# Patient Record
Sex: Female | Born: 1980 | Race: Black or African American | Hispanic: No | Marital: Single | Smoking: Never smoker
Health system: Southern US, Community
[De-identification: ages and names within clinical notes are randomized; demographics above are authoritative.]

## PROBLEM LIST (undated history)

## (undated) DIAGNOSIS — D869 Sarcoidosis, unspecified: Secondary | ICD-10-CM

## (undated) DIAGNOSIS — I10 Essential (primary) hypertension: Secondary | ICD-10-CM

---

## 2014-08-08 ENCOUNTER — Emergency Department (HOSPITAL_COMMUNITY): Payer: BLUE CROSS/BLUE SHIELD

## 2014-08-08 ENCOUNTER — Emergency Department (HOSPITAL_COMMUNITY)
Admission: EM | Admit: 2014-08-08 | Discharge: 2014-08-08 | Disposition: A | Discharge status: Home / Self Care | Payer: BLUE CROSS/BLUE SHIELD | Attending: Emergency Medicine | Admitting: Emergency Medicine

## 2014-08-08 ENCOUNTER — Encounter (HOSPITAL_COMMUNITY): Payer: Self-pay | Admitting: Emergency Medicine

## 2014-08-08 DIAGNOSIS — Z862 Personal history of diseases of the blood and blood-forming organs and certain disorders involving the immune mechanism: Secondary | ICD-10-CM | POA: Insufficient documentation

## 2014-08-08 DIAGNOSIS — M5417 Radiculopathy, lumbosacral region: Secondary | ICD-10-CM | POA: Diagnosis not present

## 2014-08-08 DIAGNOSIS — I10 Essential (primary) hypertension: Secondary | ICD-10-CM | POA: Insufficient documentation

## 2014-08-08 DIAGNOSIS — M25562 Pain in left knee: Secondary | ICD-10-CM | POA: Diagnosis not present

## 2014-08-08 HISTORY — DX: Sarcoidosis, unspecified: D86.9

## 2014-08-08 HISTORY — DX: Essential (primary) hypertension: I10

## 2014-08-08 MED ORDER — TRAMADOL HCL 50 MG PO TABS: 50.0000 mg | ORAL_TABLET | Freq: Four times a day (QID) | ORAL | Status: AC | PRN

## 2014-08-08 MED ORDER — PREDNISONE 20 MG PO TABS: 40.0000 mg | ORAL_TABLET | Freq: Every day | ORAL | Status: AC

## 2014-08-08 NOTE — ED Notes (Signed)
Pt. Stated, i started having knee (left) pain  fior about a week. No injury.  Pt. Had a brace on given to her by her sister.  Pt. Stated, seems like its worse and going to my hip

## 2014-08-08 NOTE — Discharge Instructions (Signed)
Start prednisone as prescribed until all gone. Take Tylenol for pain. Tramadol for severe pain. After finish prednisone started taking anti-inflammatory such as naproxen or ibuprofen. Try back exercises below. Keep the knee elevated, ice several times a day. Follow-up with your primary care doctor or orthopedic specialist if not improving.  Lumbosacral Radiculopathy Lumbosacral radiculopathy is a pinched nerve or nerves in the low back (lumbosacral area). When this happens you may have weakness in your legs and may not be able to stand on your toes. You may have pain going down into your legs. There may be difficulties with walking normally. There are many causes of this problem. Sometimes this may happen from an injury, or simply from arthritis or boney problems. It may also be caused by other illnesses such as diabetes. If there is no improvement after treatment, further studies may be done to find the exact cause. DIAGNOSIS  X-rays may be needed if the problems become long standing. Electromyograms may be done. This study is one in which the working of nerves and muscles is studied. HOME CARE INSTRUCTIONS   Applications of ice packs may be helpful. Ice can be used in a plastic bag with a towel around it to prevent frostbite to skin. This may be used every 2 hours for 20 to 30 minutes, or as needed, while awake, or as directed by your caregiver.  Only take over-the-counter or prescription medicines for pain, discomfort, or fever as directed by your caregiver.  If physical therapy was prescribed, follow your caregiver's directions. SEEK IMMEDIATE MEDICAL CARE IF:   You have pain not controlled with medications.  You seem to be getting worse rather than better.  You develop increasing weakness in your legs.  You develop loss of bowel or bladder control.  You have difficulty with walking or balance, or develop clumsiness in the use of your legs.  You have a fever. MAKE SURE YOU:    Understand these instructions.  Will watch your condition.  Will get help right away if you are not doing well or get worse. Document Released: 01/22/2005 Document Revised: 04/16/2011 Document Reviewed: 09/12/2007 [facility] Patient Information 2015 [place], [place]. This information is not intended to replace advice given to you by your health care provider. Make sure you discuss any questions you have with your health care provider.  Back Exercises These exercises may help you when beginning to rehabilitate your injury. Your symptoms may resolve with or without further involvement from your physician, physical therapist or athletic trainer. While completing these exercises, remember:   Restoring tissue flexibility helps normal motion to return to the joints. This allows healthier, less painful movement and activity.  An effective stretch should be held for at least 30 seconds.  A stretch should never be painful. You should only feel a gentle lengthening or release in the stretched tissue. STRETCH - Extension, Prone on Elbows   Lie on your stomach on the floor, a bed will be too soft. Place your palms about shoulder width apart and at the height of your head.  Place your elbows under your shoulders. If this is too painful, stack pillows under your chest.  Allow your body to relax so that your hips drop lower and make contact more completely with the floor.  Hold this position for __________ seconds.  Slowly return to lying flat on the floor. Repeat __________ times. Complete this exercise __________ times per day.  RANGE OF MOTION - Extension, Prone Press Ups   Lie on your  stomach on the floor, a bed will be too soft. Place your palms about shoulder width apart and at the height of your head.  Keeping your back as relaxed as possible, slowly straighten your elbows while keeping your hips on the floor. You may adjust the placement of your hands to maximize your comfort. As you gain  motion, your hands will come more underneath your shoulders.  Hold this position __________ seconds.  Slowly return to lying flat on the floor. Repeat __________ times. Complete this exercise __________ times per day.  RANGE OF MOTION- Quadruped, Neutral Spine   Assume a hands and knees position on a firm surface. Keep your hands under your shoulders and your knees under your hips. You may place padding under your knees for comfort.  Drop your head and point your tail bone toward the ground below you. This will round out your low back like an angry cat. Hold this position for __________ seconds.  Slowly lift your head and release your tail bone so that your back sags into a large arch, like an old horse.  Hold this position for __________ seconds.  Repeat this until you feel limber in your low back.  Now, find your "sweet spot." This will be the most comfortable position somewhere between the two previous positions. This is your neutral spine. Once you have found this position, tense your stomach muscles to support your low back.  Hold this position for __________ seconds. Repeat __________ times. Complete this exercise __________ times per day.  STRETCH - Flexion, Single Knee to Chest   Lie on a firm bed or floor with both legs extended in front of you.  Keeping one leg in contact with the floor, bring your opposite knee to your chest. Hold your leg in place by either grabbing behind your thigh or at your knee.  Pull until you feel a gentle stretch in your low back. Hold __________ seconds.  Slowly release your grasp and repeat the exercise with the opposite side. Repeat __________ times. Complete this exercise __________ times per day.  STRETCH - Hamstrings, Standing  Stand or sit and extend your right / left leg, placing your foot on a chair or foot stool  Keeping a slight arch in your low back and your hips straight forward.  Lead with your chest and lean forward at the  waist until you feel a gentle stretch in the back of your right / left knee or thigh. (When done correctly, this exercise requires leaning only a small distance.)  Hold this position for __________ seconds. Repeat __________ times. Complete this stretch __________ times per day. STRENGTHENING - Deep Abdominals, Pelvic Tilt   Lie on a firm bed or floor. Keeping your legs in front of you, bend your knees so they are both pointed toward the ceiling and your feet are flat on the floor.  Tense your lower abdominal muscles to press your low back into the floor. This motion will rotate your pelvis so that your tail bone is scooping upwards rather than pointing at your feet or into the floor.  With a gentle tension and even breathing, hold this position for __________ seconds. Repeat __________ times. Complete this exercise __________ times per day.  STRENGTHENING - Abdominals, Crunches   Lie on a firm bed or floor. Keeping your legs in front of you, bend your knees so they are both pointed toward the ceiling and your feet are flat on the floor. Cross your arms over your chest.  Slightly tip your chin down without bending your neck.  Tense your abdominals and slowly lift your trunk high enough to just clear your shoulder blades. Lifting higher can put excessive stress on the low back and does not further strengthen your abdominal muscles.  Control your return to the starting position. Repeat __________ times. Complete this exercise __________ times per day.  STRENGTHENING - Quadruped, Opposite UE/LE Lift   Assume a hands and knees position on a firm surface. Keep your hands under your shoulders and your knees under your hips. You may place padding under your knees for comfort.  Find your neutral spine and gently tense your abdominal muscles so that you can maintain this position. Your shoulders and hips should form a rectangle that is parallel with the floor and is not twisted.  Keeping your  trunk steady, lift your right hand no higher than your shoulder and then your left leg no higher than your hip. Make sure you are not holding your breath. Hold this position __________ seconds.  Continuing to keep your abdominal muscles tense and your back steady, slowly return to your starting position. Repeat with the opposite arm and leg. Repeat __________ times. Complete this exercise __________ times per day. Document Released: 02/09/2005 Document Revised: 04/16/2011 Document Reviewed: 05/06/2008 [facility] Patient Information 2015 [place], [place]. This information is not intended to replace advice given to you by your health care provider. Make sure you discuss any questions you have with your health care provider.

## 2014-08-08 NOTE — ED Provider Notes (Signed)
CSN: [id number]     Arrival date & time 08/08/14  0815 History  This chart was scribed for non-physician practitioner, Jaynie Crumbleatyana Quinnlyn Hearns, PA-C working with Mancel BaleElliott Wentz, MD by Freida Busmaniana Omoyeni, ED Scribe. This patient was seen in room TR05C/TR05C and the patient's care was started at 9:27 AM.    Chief Complaint  Patient presents with  . Knee Pain  . Hip Pain   The history is provided by the patient. No language interpreter was used.     HPI Comments:  Vickie Maynard is a 34 y.o. female who presents to the Emergency Department complaining of moderate left knee pain for 5 days. She reports sharp throbbing pain that has been constant today and notes the pain now radiates to her  left hip and left calf. She reports associated mild swelling of the knee. Pain on lateral aspect of the knee only.  Until last night she has been taking tylenol with relief. She has also applied ice with mild relief.   She has increased pain with pressure on the knee while either ambulating or in certain seated/laying positions. She denies tingling of the extremity. She also denies exacerbation of pain with movement of the knee and recent injury/fall.    Past Medical History  Diagnosis Date  . Hypertension   . Sarcoidosis    History reviewed. No pertinent past surgical history. No family history on file. History  Substance Use Topics  . Smoking status: Never Smoker   . Smokeless tobacco: Not on file  . Alcohol Use: Yes   OB History    No data available     Review of Systems  Constitutional: Negative for fever and chills.  Musculoskeletal: Positive for myalgias and arthralgias.  Neurological: Negative for weakness and numbness.      Allergies  Review of patient's allergies indicates not on file.  Home Medications   Prior to Admission medications   Not on File   BP 150/93 mmHg  Pulse 83  Temp(Src) 98.9 F (37.2 C) (Oral)  Resp 18  Ht 5\' 5"  (1.651 m)  Wt 210 lb (95.255 kg)  BMI 34.95 kg/m2   SpO2 100%  LMP 07/16/2014 Physical Exam  Constitutional: She appears well-developed and well-nourished. No distress.  HENT:  Head: Normocephalic and atraumatic.  Eyes: Conjunctivae are normal.  Neck: Normal range of motion.  Cardiovascular: Normal rate.   Pulmonary/Chest: Effort normal.  Musculoskeletal: Normal range of motion.  No tenderness to palpation over the left knee joint. Joint does not appear to be swollen, it is not red, is not warm to touch. Full range of motion of the left knee with no pain. No pain with left straight leg raise. 9 tenderness to palpation or midline lumbar or sacral area. No tenderness to palpation over left SI joint or buttock. Dorsal pedal pulses intact bilaterally  Neurological: She is alert.  5/5 and equal lower extremity strength. 2+ and equal patellar reflexes bilaterally. Pt able to dorsiflex bilateral toes and feet with good strength against resistance. Equal sensation bilaterally over thighs and lower legs.   Skin: Skin is warm and dry.  Nursing note and vitals reviewed.   ED Course  Procedures   DIAGNOSTIC STUDIES:  Oxygen Saturation is 100% on RA, normal by my interpretation.    COORDINATION OF CARE:  9:33 AM Will order XR of the knee. Discussed treatment plan with pt at bedside and pt agreed to plan.  Labs Review Labs Reviewed - No data to display  Imaging Review  Dg Knee Complete 4 Views Left  08/08/2014   CLINICAL DATA:  Moderate left knee pain for 5 days. No known injury.  EXAM: LEFT KNEE - COMPLETE 4+ VIEW  COMPARISON:  None.  FINDINGS: There is no evidence of fracture, dislocation, or joint effusion. There is no evidence of arthropathy or other focal bone abnormality. Soft tissues are unremarkable.  IMPRESSION: Negative.   Electronically Signed   By: Charlett Nose M.D.   On: 08/08/2014 10:11     EKG Interpretation None      MDM   Final diagnoses:  Left knee pain  Lumbosacral radiculopathy   Patient with left knee pain, that  radiates up to the hip into the left calf over the lateral aspect of the leg only. No pain over medial part of the leg. No swelling of the exam. No calf tenderness. I do not think she has a DVT. Pain is worsened with activity. X-ray of the left knee is negative. Exam is most consistent with possible radicular pain given an radiates into the hip and down to the calf. At this time she has no neuro deficits. No evidence of cauda equina. Will discharge home with prednisone, tramadol, Tylenol. Follow-up with primary care doctor. Return precautions discussed  Filed Vitals:   08/08/14 0835  BP: 150/93  Pulse: 83  Temp: 98.9 F (37.2 C)  TempSrc: Oral  Resp: 18  Height:  (1.651 m)  Weight: 210 lb (95.255 kg)  SpO2: 100%   I personally performed the services described in this documentation, which was scribed in my presence. The recorded information has been reviewed and is accurate.   Jaynie Crumble, PA-C 08/08/14 1034  Mancel Bale, MD 08/08/14 [phone]

## 2014-08-08 NOTE — ED Notes (Signed)
Declined W/C at D/C and was escorted to lobby by RN. 

## 2014-09-14 ENCOUNTER — Ambulatory Visit: Payer: BLUE CROSS/BLUE SHIELD | Attending: Physician Assistant | Admitting: Physical Therapy

## 2014-09-14 DIAGNOSIS — M25562 Pain in left knee: Secondary | ICD-10-CM

## 2014-09-14 DIAGNOSIS — M461 Sacroiliitis, not elsewhere classified: Secondary | ICD-10-CM

## 2014-09-14 NOTE — Therapy (Addendum)
[place], [place], [address] Phone: [phone]   Fax:  [phone]  Physical Therapy Evaluation/Discharge  Patient Details  Name: Vickie Maynard MRN: [id number] Date of Birth: 11-Feb-1980 Referring Provider:  Idelia Salm, PA-C  Encounter Date: 09/14/2014      PT End of Session - 09/14/14 1613    Visit Number 1   Number of Visits 12   Date for PT Re-Evaluation 10/26/14   PT Start Time 1500   PT Stop Time 1550   PT Time Calculation (min) 50 min   Activity Tolerance Patient tolerated treatment well      Past Medical History  Diagnosis Date  . Hypertension   . Sarcoidosis     No past surgical history on file.  There were no vitals filed for this visit.  Visit Diagnosis:  Left knee pain  Inflammation of left sacroiliac joint      Subjective Assessment - 09/14/14 1502    Subjective Knee pain since 07/24/14.   Her low back pain developed after that.  She does not recall any incident or trauma.  Her symptoms include LLE weakness, difficulty getting into a standing position (from kneeling) and stairs.     Pertinent History Works as a Marine scientist, nights mostly.    Limitations Walking;Standing;Other (comment);Lifting  stairs   How long can you stand comfortably? varies, can be a few min to 30 min    Diagnostic tests XR "bulging disc" 08/23/14. Mild dextroconvex curvature of the lumbar spine.Mild disc space height loss at L4-L5 and L5-S1.   Patient Stated Goals learn how to help pain without meds   Currently in Pain? Yes   Pain Score 3    Pain Location Knee   Pain Orientation Left;Lateral   Pain Descriptors / Indicators Burning;Aching   Pain Type Chronic pain   Pain Onset More than a month ago   Pain Frequency Intermittent   Aggravating Factors  standing too long   Pain Relieving Factors meds, sitting eases   Multiple Pain Sites Yes   Pain Score 0   Pain Location Back  was earlier, burning  and 7/10, hip/SIJ   Pain Orientation Left   Pain Descriptors / Indicators Burning   Pain Type Chronic pain   Pain Onset More than a month ago   Pain Frequency Intermittent   Aggravating Factors  not related to any position (increased in bed), more random   Pain Relieving Factors meds   Effect of Pain on Daily Activities difficulty getting out of bed            [facility] PT Assessment - 09/14/14 1517    Assessment   Medical Diagnosis SIJ pain, LLE pain, L knee pain    Onset Date/Surgical Date 07/24/14   Prior Therapy No    Precautions   Precautions None   Restrictions   Weight Bearing Restrictions No   Balance Screen   Has the patient fallen in the past 6 months No   Prior Function   Level of Independence Independent   Cognition   Overall Cognitive Status Within Functional Limits for tasks assessed   Observation/Other Assessments   Focus on Therapeutic Outcomes (FOTO)  30%   goal 23%   Sensation   Light Touch Appears Intact   Coordination   Gross Motor Movements are Fluid and Coordinated Not tested   Posture/Postural Control   Posture/Postural Control Postural limitations   Postural Limitations Left pelvic obliquity   Posture Comments genu  valgus and mild recurvatum   AROM   Right/Left Knee --  [facility] without pain   Lumbar Flexion WNL   Lumbar Extension WNL   Lumbar - Right Side Bend WNL   Lumbar - Left Side Bend WNL   Lumbar - Right Rotation WNL   Lumbar - Left Rotation WNL   Strength   Right Hip Flexion 5/5   Right Hip Extension 5/5   Right Hip ABduction 4+/5   Left Hip Flexion 3+/5   Left Hip Extension 4/5   Left Hip ABduction 4-/5   Right Knee Flexion 5/5   Right Knee Extension 5/5   Left Knee Flexion 5/5   Left Knee Extension 5/5   Right/Left Ankle --  5/5   Flexibility   Soft Tissue Assessment /Muscle Length yes   Hamstrings 90    Palpation   Patella mobility crepitus L knee>Rt. and slightly hypomobile   Spinal mobility hypomobile throughout thoracic  and lumbar spine, no pain    SI assessment  neg compression and normal landmarks   Palpation comment no pain or tenderness in hips, gluteals, lateral thigh or paraspinals   Prone Knee Bend Test   Findings Negative   Side Left   Comment and Rt.    Straight Leg Raise   Findings Negative   Side  Left   Comment and Rt.   Patellofemoral Grind test (Clark's Sign)   Findings Negative   Side  Left   Balance   Balance Assessed Yes   Static Standing Balance   Static Standing - Balance Support No upper extremity supported   Static Standing - Level of Assistance 7: Independent   Static Standing Balance -  Activities  Single Leg Stance - Right Leg;Single Leg Stance - Left Leg   Static Standing - Comment/# of Minutes L side less stable than Rt.      Self Care: demo Transverse Abd contraction, advised to progress to HEP      PT Education - 09/14/14 1612    Education provided Yes   Education Details PT/POC, HEP  and core/abdominals, possible differential diagnosis   Person(s) Educated Patient   Methods Explanation;Demonstration;Handout   Comprehension Verbalized understanding;Returned demonstration          PT Short Term Goals - 09/14/14 1623    PT SHORT TERM GOAL #1   Title Pt will be I with HEP for core/L-stab   Time 2   Period Weeks   Status New   PT SHORT TERM GOAL #2   Title Pt will be able to report less pain in AM (no more than 5/10) when it occurs.    Time 4   Period Weeks   Status New           PT Long Term Goals - 09/14/14 1624    PT LONG TERM GOAL #1   Title Pt will be I with more advanced HEP   Time 6   Period Weeks   Status New   PT LONG TERM GOAL #2   Title Pt will score </= 23% limited on FOTO to demo functional improvement    Time 6   Period Weeks   Status New   PT LONG TERM GOAL #3   Title Pt will be able to report only min occasional pain in hip, knee    Time 6   Period Weeks   Status New   PT LONG TERM GOAL #4   Title Pt will participate in  community based exercise  2-3 times per week for continued fitness and report no pain increase.    Time 6   Period Weeks   Status New               Plan - 09/14/14 1614    Clinical Impression Statement Patient presents with fairly new onset of SIJ with referral to LLE/knee.  She has L sided hip flexor, abd and ext weakness.  No sensory disturbance, and no pain elicited today during eval. She will benefit from skilled PT to improve pain especially in AM and with standing, core strength and prevent progression of symptoms.      Pt will benefit from skilled therapeutic intervention in order to improve on the following deficits Pain;Hypomobility;Decreased mobility;Decreased strength;Decreased balance   Rehab Potential Excellent   PT Frequency 2x / week   PT Duration 6 weeks   PT Treatment/Interventions Therapeutic exercise;Neuromuscular re-education;Manual techniques;Cryotherapy;Electrical Stimulation;Moist Heat;Ultrasound;Patient/family education   PT Next Visit Plan check HEP and progress as tolerated- hip strength   PT Home Exercise Plan prepilates/L stab (gave Transverse ABd handout from file as well)   Consulted and Agree with Plan of Care Patient         Problem List There are no active problems to display for this patient.   Lousie Calico 09/14/2014, 4:29 PM  [facility] [address] [place], [place], [address] Phone: [phone]   Fax:  [phone]  Raeford Razor, PT 09/14/2014 4:30 PM Phone: [phone] Fax: [phone]  PHYSICAL THERAPY DISCHARGE SUMMARY  Visits from Start of Care: 1  Current functional level related to goals / functional outcomes: Unknown   Remaining deficits: Unknown   Education / Equipment: PT. POC, HEP Plan: Patient agrees to discharge.  Patient goals were not met. Patient is being discharged due to not returning since the last visit.  ?????    Raeford Razor, PT 05/12/2015 10:51  AM Phone: [phone] Fax: [phone]

## 2014-09-14 NOTE — Patient Instructions (Signed)

## 2014-09-27 ENCOUNTER — Ambulatory Visit: Payer: BLUE CROSS/BLUE SHIELD | Admitting: Physical Therapy

## 2014-10-01 ENCOUNTER — Ambulatory Visit: Payer: BLUE CROSS/BLUE SHIELD | Admitting: Physical Therapy

## 2016-05-16 IMAGING — DX DG KNEE COMPLETE 4+V*L*
4 series · 4 of 4 positions shown · non-contrast
Comparison: None.

CLINICAL DATA: Moderate left knee pain for 5 days. No known injury.

EXAM:
LEFT KNEE - COMPLETE 4+ VIEW

[knee ap]
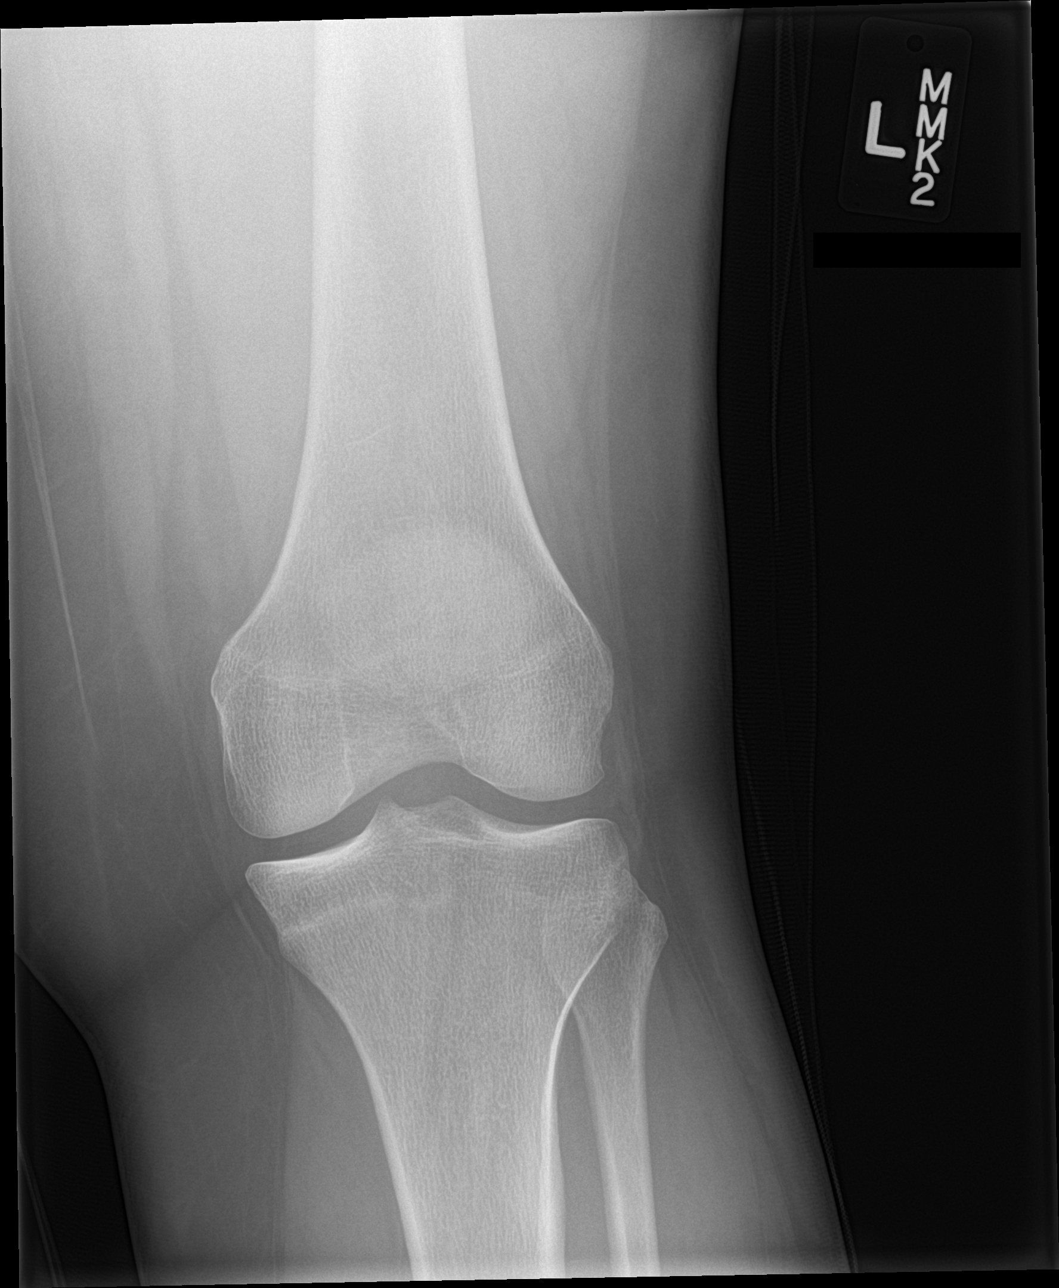

[tunnel]
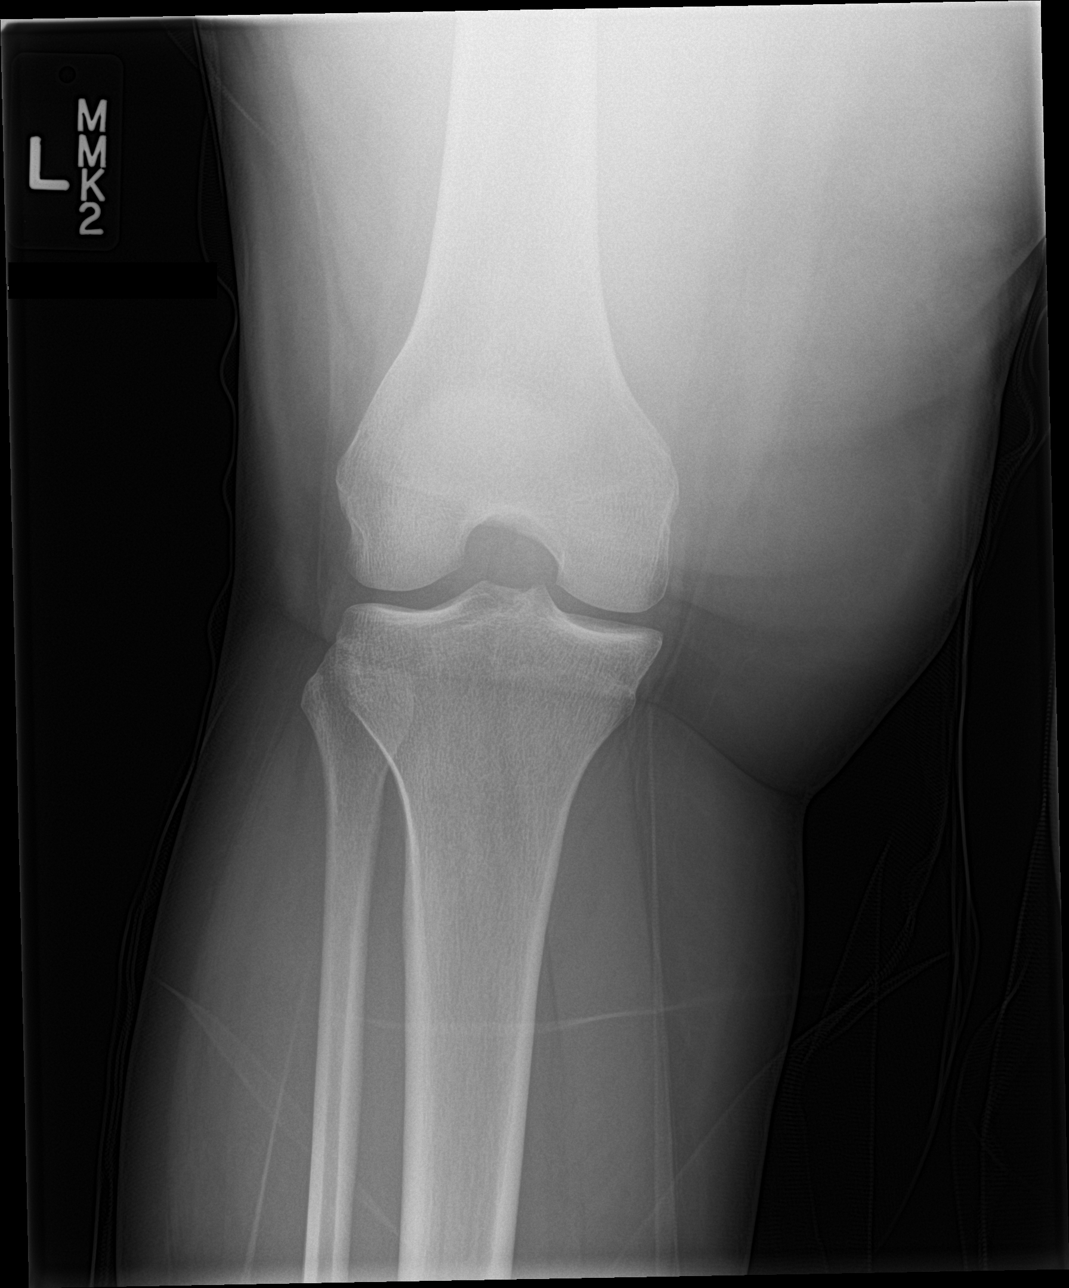

[knee lat]
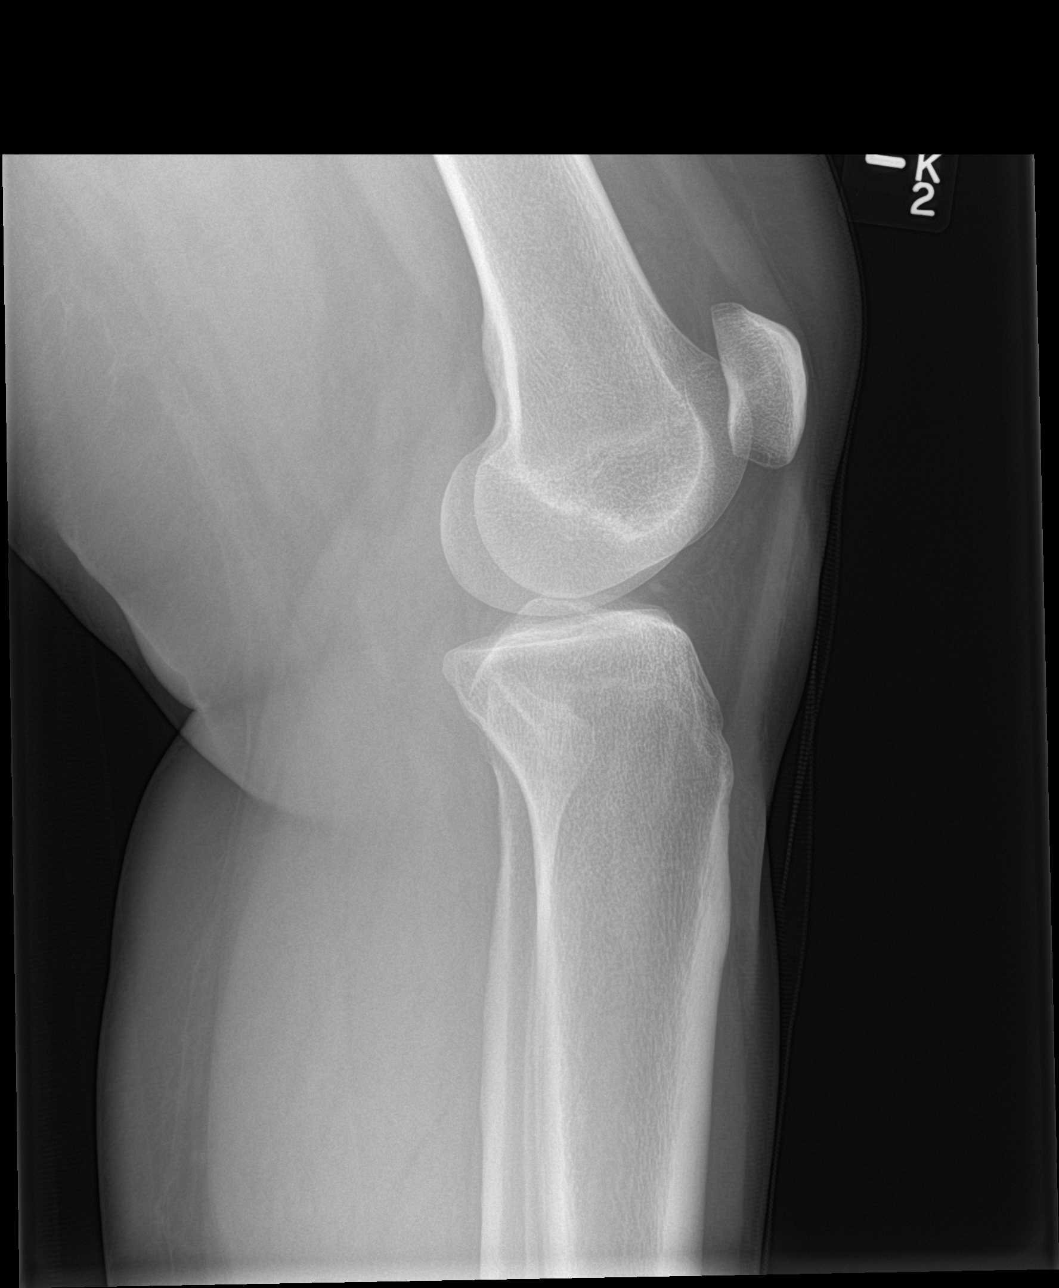

[knee sunrise]
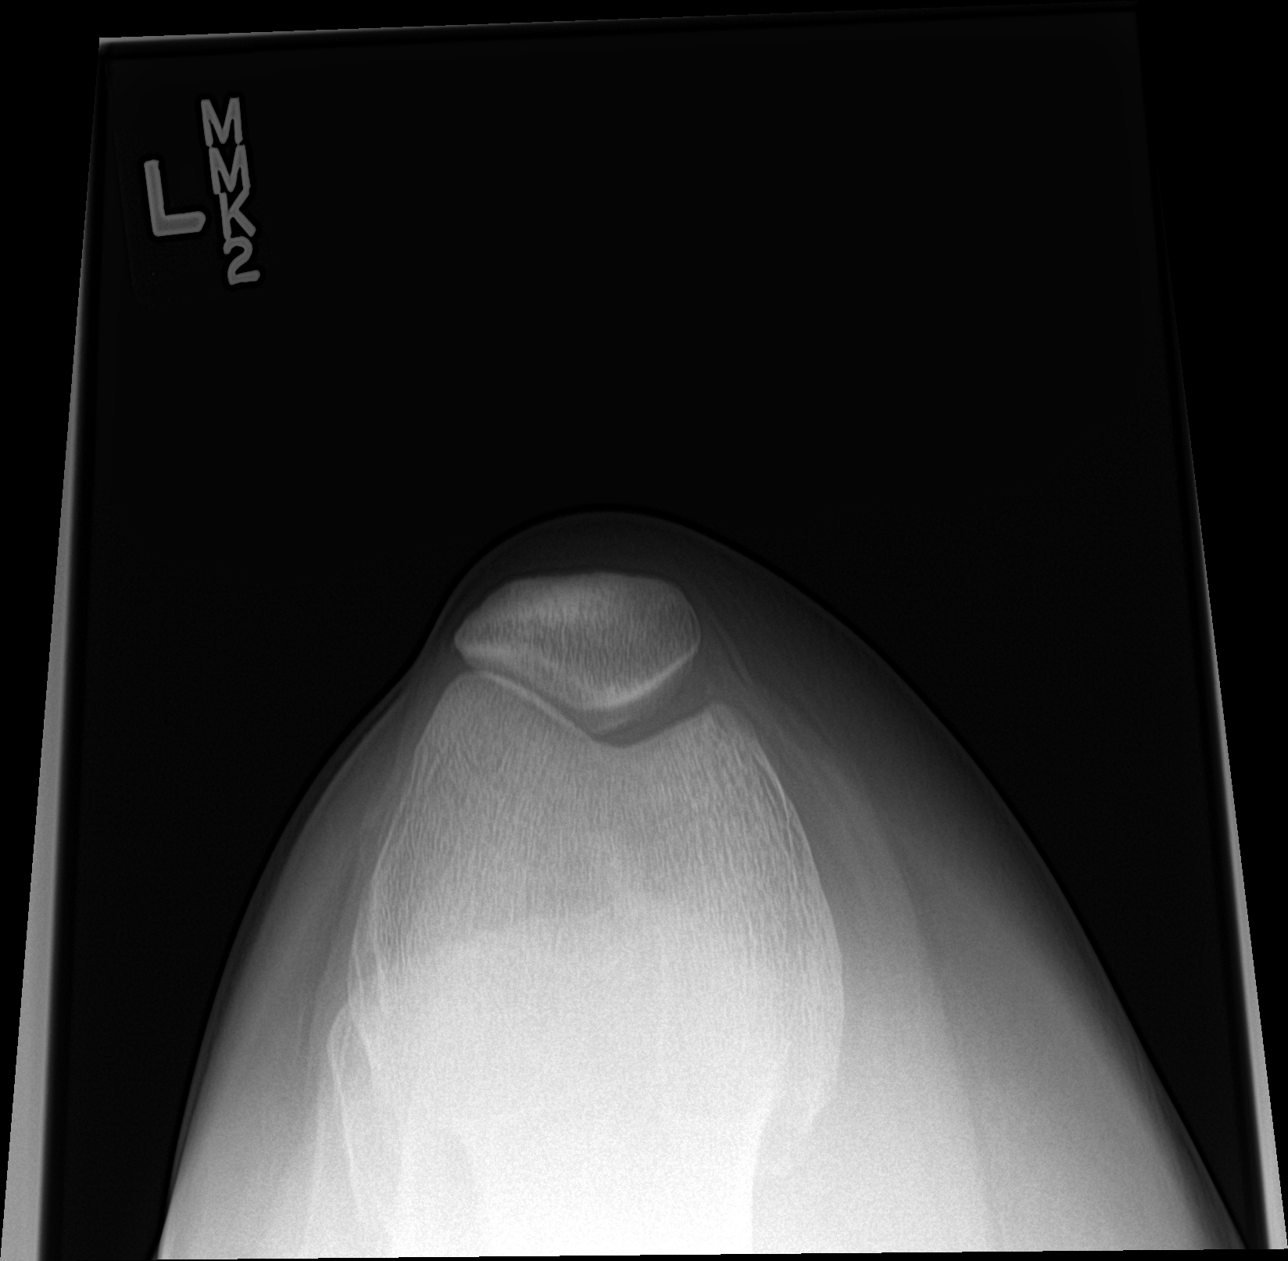

[4 of 4 positions shown; findings below may reference images not displayed]

FINDINGS: There is no evidence of fracture, dislocation, or joint effusion.
There is no evidence of arthropathy or other focal bone abnormality.
Soft tissues are unremarkable.
IMPRESSION: Negative.
